# Patient Record
Sex: Female | Born: 1955 | Hispanic: Yes | Marital: Married | Smoking: Former smoker
Health system: Southern US, Community
[De-identification: ages and names within clinical notes are randomized; demographics above are authoritative.]

## PROBLEM LIST (undated history)

## (undated) DIAGNOSIS — E7800 Pure hypercholesterolemia, unspecified: Secondary | ICD-10-CM

## (undated) DIAGNOSIS — E079 Disorder of thyroid, unspecified: Secondary | ICD-10-CM

## (undated) HISTORY — DX: Disorder of thyroid, unspecified: E07.9

## (undated) HISTORY — PX: TONSILLECTOMY: SUR1361

## (undated) HISTORY — PX: LAPAROSCOPIC TUBAL LIGATION: SUR803

---

## 2004-02-11 ENCOUNTER — Ambulatory Visit: Payer: Self-pay

## 2004-05-11 ENCOUNTER — Emergency Department: Payer: Self-pay | Admitting: Emergency Medicine

## 2015-10-08 ENCOUNTER — Other Ambulatory Visit: Payer: Self-pay

## 2015-10-08 ENCOUNTER — Emergency Department: Payer: Self-pay

## 2015-10-08 ENCOUNTER — Emergency Department
Admission: EM | Admit: 2015-10-08 | Discharge: 2015-10-08 | Disposition: A | Discharge status: Home / Self Care | Payer: Self-pay | Attending: Emergency Medicine | Admitting: Emergency Medicine

## 2015-10-08 ENCOUNTER — Encounter: Payer: Self-pay | Admitting: *Deleted

## 2015-10-08 DIAGNOSIS — J018 Other acute sinusitis: Secondary | ICD-10-CM | POA: Insufficient documentation

## 2015-10-08 DIAGNOSIS — R0602 Shortness of breath: Secondary | ICD-10-CM | POA: Insufficient documentation

## 2015-10-08 DIAGNOSIS — Z87891 Personal history of nicotine dependence: Secondary | ICD-10-CM | POA: Insufficient documentation

## 2015-10-08 DIAGNOSIS — R42 Dizziness and giddiness: Secondary | ICD-10-CM

## 2015-10-08 LAB — BASIC METABOLIC PANEL
Anion gap: 8 (ref 5–15)
BUN: 19 mg/dL (ref 6–20)
CALCIUM: 9 mg/dL (ref 8.9–10.3)
CHLORIDE: 101 mmol/L (ref 101–111)
CO2: 27 mmol/L (ref 22–32)
CREATININE: 0.74 mg/dL (ref 0.44–1.00)
GFR calc Af Amer: >60 mL/min (ref >60)
GFR calc non Af Amer: >60 mL/min (ref >60)
Glucose, Bld: 112 mg/dL — ABNORMAL HIGH (ref 65–99)
Potassium: 3.8 mmol/L (ref 3.5–5.1)
SODIUM: 136 mmol/L (ref 135–145)

## 2015-10-08 LAB — URINALYSIS COMPLETE WITH MICROSCOPIC (ARMC ONLY)
BACTERIA UA: NONE SEEN
BILIRUBIN URINE: NEGATIVE
GLUCOSE, UA: NEGATIVE mg/dL
Hgb urine dipstick: NEGATIVE
Ketones, ur: NEGATIVE mg/dL
Leukocytes, UA: NEGATIVE
Nitrite: NEGATIVE
Protein, ur: NEGATIVE mg/dL
SQUAMOUS EPITHELIAL / LPF: NONE SEEN
Specific Gravity, Urine: 1.006 (ref 1.005–1.030)
pH: 8 (ref 5.0–8.0)

## 2015-10-08 LAB — CBC
HCT: 41.3 % (ref 35.0–47.0)
Hemoglobin: 14 g/dL (ref 12.0–16.0)
MCH: 29.4 pg (ref 26.0–34.0)
MCHC: 33.9 g/dL (ref 32.0–36.0)
MCV: 86.9 fL (ref 80.0–100.0)
PLATELETS: 275 10*3/uL (ref 150–440)
RBC: 4.76 MIL/uL (ref 3.80–5.20)
RDW: 12.8 % (ref 11.5–14.5)
WBC: 11.6 10*3/uL — AB (ref 3.6–11.0)

## 2015-10-08 LAB — BRAIN NATRIURETIC PEPTIDE: B Natriuretic Peptide: 23 pg/mL (ref 0.0–100.0)

## 2015-10-08 LAB — TROPONIN I

## 2015-10-08 MED ORDER — AMOXICILLIN-POT CLAVULANATE 875-125 MG PO TABS: 1.0000 | ORAL_TABLET | Freq: Two times a day (BID) | ORAL | Status: AC

## 2015-10-08 MED ORDER — MECLIZINE HCL 32 MG PO TABS: 32.0000 mg | ORAL_TABLET | Freq: Three times a day (TID) | ORAL | Status: DC | PRN

## 2015-10-08 NOTE — ED Notes (Signed)
Pt returned from MRI at this time

## 2015-10-08 NOTE — ED Notes (Signed)
Patient transported to MRI 

## 2015-10-08 NOTE — ED Notes (Signed)
Pt complains of waking up with shortness of breath, dizziness and chest discomfort, pt is in no distress in triage

## 2015-10-08 NOTE — ED Notes (Signed)
Pt ambulated to bathroom at this time independently with no concerns. Pt tolerating well.

## 2015-10-08 NOTE — ED Provider Notes (Signed)
[facility] Emergency Department Provider Note   ____________________________________________  Time seen: Approximately 9:16 AM  I have reviewed the triage vital signs and the nursing notes.   HISTORY  Chief Complaint Shortness of Breath and Dizziness    HPI Jordan Watts is a 60 y.o. female patient reports she got up in the middle of the night and had a lot of pressure in her head. She noticed she was sweating a lot and had some chest discomfort. Chest really wasn't a key to her anything he just didn't feel right perhaps tightness. When patient got up out of bed she noted she was very unsteady things were not spinning that she was lightheaded and unsteady and dizzy. She had 2 hold onto the wall because she had trouble walking. Present she still has some head pressure she also has swimmy headed feeling when she moves her head back and forth especially if she is laying flat. She reports she had this vertigo about 5 years ago and this is different. Things are not spinning around. She reports she's had several months of pain in her left breast she cannot really tell me what made the pain worse or what brought it on.   History reviewed. No pertinent past medical history.  There are no active problems to display for this patient.   History reviewed. No pertinent past surgical history.  Current Outpatient Rx  Name  Route  Sig  Dispense  Refill  . amoxicillin-clavulanate (AUGMENTIN) 875-125 MG tablet   Oral   Take 1 tablet by mouth 2 (two) times daily.   20 tablet   0   . meclizine (ANTIVERT) 32 MG tablet   Oral   Take 1 tablet (32 mg total) by mouth 3 (three) times daily as needed.   30 tablet   0     Allergies Review of patient's allergies indicates no known allergies.  No family history on file.  Social History Social History  Substance Use Topics  . Smoking status: Former Games developermoker  . Smokeless tobacco: None  . Alcohol Use: No     Review of Systems Constitutional: No fever/chills Eyes: No visual changes. ENT: No sore throat. Cardiovascular: Denies chest pain. Respiratory: Denies shortness of breath. Gastrointestinal: No abdominal pain.  No nausea, no vomiting.  No diarrhea.  No constipation. Genitourinary: Negative for dysuria. Musculoskeletal: Negative for back pain. Skin: Negative for rash. Neurological: Negative for focal weakness or numbness.  10-point ROS otherwise negative.  ____________________________________________   PHYSICAL EXAM:  VITAL SIGNS: ED Triage Vitals  Enc Vitals Group     BP 10/08/15 0842 156/71 mmHg     Pulse Rate 10/08/15 0842 81     Resp 10/08/15 0842 20     Temp 10/08/15 0842 97.8 F (36.6 C)     Temp Source 10/08/15 0842 Oral     SpO2 10/08/15 0842 99 %     Weight 10/08/15 0842 108 lb (48.988 kg)     Height 10/08/15 0842 5\' 1"  (1.549 m)     Head Cir --      Peak Flow --      Pain Score 10/08/15 0843 0     Pain Loc --      Pain Edu? --      Excl. in GC? --     Constitutional: Alert and oriented. Well appearing and in no acute distress. Eyes: Conjunctivae are normal. PERRL. EOMI. Head: Atraumatic. Nose: No congestion/rhinnorhea. Mouth/Throat: Mucous membranes are moist.  Oropharynx non-erythematous. Neck: No stridor. Cardiovascular: Normal rate, regular rhythm. Grossly normal heart sounds.  Good peripheral circulation. Respiratory: Normal respiratory effort.  No retractions. Lungs CTAB. Gastrointestinal: Soft and nontender. No distention. No abdominal bruits. No CVA tenderness. Musculoskeletal: No lower extremity tenderness nor edema.  No joint effusions. Neurologic:  Normal speech and language. No gross focal neurologic deficits are appreciated. No gait instability. Skin:  Skin is warm, dry and intact. No rash noted. Psychiatric: Mood and affect are normal. Speech and behavior are normal.  ____________________________________________   LABS (all labs  ordered are listed, but only abnormal results are displayed)  Labs Reviewed  BASIC METABOLIC PANEL - Abnormal; Notable for the following:    Glucose, Bld 112 (*)    All other components within normal limits  CBC - Abnormal; Notable for the following:    WBC 11.6 (*)    All other components within normal limits  URINALYSIS COMPLETEWITH MICROSCOPIC ([facility] ONLY) - Abnormal; Notable for the following:    Color, Urine STRAW (*)    APPearance CLEAR (*)    All other components within normal limits  TROPONIN I  BRAIN NATRIURETIC PEPTIDE   ____________________________________________  EKG  EKG read and interpreted by me shows normal sinus rhythm rate of 70 normal axis normal EKG ____________________________________________  RADIOLOGY  CHEST 2 VIEW  COMPARISON: None.  FINDINGS: Lungs are clear. Heart size and pulmonary vascularity are normal. No adenopathy. No bone lesions.  IMPRESSION: No edema or consolidation.   Electronically Signed  By: Bretta Bang III M.D.  On: 10/08/2015 09:44   _________MRI HEAD WITHOUT CONTRAST  TECHNIQUE: Multiplanar, multiecho pulse sequences of the brain and surrounding structures were obtained without intravenous contrast.  COMPARISON: None.  FINDINGS: Cerebral volume is normal. No restricted diffusion to suggest acute infarction. No midline shift, mass effect, evidence of mass lesion, ventriculomegaly, extra-axial collection or acute intracranial hemorrhage. Cervicomedullary junction and pituitary are within normal limits. Negative visualized cervical spine. Major intracranial vascular flow voids appear normal.  Scattered small mostly subcortical white matter T2 and FLAIR hyperintense foci in both hemispheres are nonspecific. The frontal lobes mostly are affected. No cortical encephalomalacia. No chronic cerebral blood products or abnormal mineralization evident on T2 * imaging. Deep gray matter nuclei, brainstem,  and cerebellum appear normal.  Visible internal auditory structures appear normal. Mastoids are clear. Negative orbit and scalp soft tissues. Normal bone marrow signal.  Paranasal sinus inflammation including right greater than left maxillary sinus fluid levels and scattered sinus mucosal thickening and opacification.  IMPRESSION: 1. No acute intracranial abnormality. 2. Mild to moderate for age nonspecific cerebral white matter signal changes. 3. Paranasal sinus inflammation suggesting acute sinusitis in the appropriate clinical setting.   Electronically Signed  By: Odessa Fleming M.D.  On: 10/08/2015 10:58___________________________________   PROCEDURES    ____________________________________________   INITIAL IMPRESSION / ASSESSMENT AND PLAN / ED COURSE  Pertinent labs & imaging results that were available during my care of the patient were reviewed by me and considered in my medical decision making (see chart for details).   ____________________________________________   FINAL CLINICAL IMPRESSION(S) / ED DIAGNOSES  Final diagnoses:  Dizziness  Other acute sinusitis      NEW MEDICATIONS STARTED DURING THIS VISIT:  Discharge Medication List as of 10/08/2015  3:46 PM    START taking these medications   Details  amoxicillin-clavulanate (AUGMENTIN) 875-125 MG tablet Take 1 tablet by mouth 2 (two) times daily., Starting 10/08/2015, Until Sun 10/18/15, Print    meclizine (ANTIVERT)  32 MG tablet Take 1 tablet (32 mg total) by mouth 3 (three) times daily as needed., Starting 10/08/2015, Until Discontinued, Print         Note:  This document was prepared using Dragon voice recognition software and may include unintentional dictation errors.    Arnaldo Natal, MD 10/08/15 1640

## 2015-10-08 NOTE — Discharge Instructions (Signed)
Mareos (Dizziness) Los mareos son un problema muy frecuente. Se trata de una sensacin de inestabilidad o de desvanecimiento. Puede sentir que se va a desmayar. Un mareo puede provocarle una lesin si se tropieza o se cae. Las Dealer de todas las edades pueden sufrir Research scientist (life sciences), Biomedical engineer es ms frecuente en los adultos [place]. Esta afeccin puede tener muchas causas, entre las que se pueden [employer], la deshidratacin y [place]. INSTRUCCIONES PARA EL CUIDADO EN EL HOGAR Estas indicaciones pueden ayudarlo con el trastorno: Comida y bebida  Beba suficiente lquido para Pharmacologist la orina clara o de color amarillo plido. Esto evita la deshidratacin. Trate de beber ms lquidos transparentes, como agua.  No beba alcohol.  Limite el consumo de cafena si el mdico se lo indica.  Limite el consumo de sal si el mdico se lo indica. Actividad  Evite los movimientos rpidos.  Levntese de las sillas con lentitud y apyese hasta sentirse bien.  Por la maana, sintese primero a un lado de la cama. Cuando se sienta bien, pngase lentamente de [address] se sostiene de algo, hasta que sepa que ha logrado el equilibrio.  Mueva las piernas con frecuencia si debe estar de pie en un lugar durante mucho tiempo. Mientras est de pie, contraiga y relaje los msculos de las piernas.  No conduzca vehculos ni opere maquinaria pesada si se siente mareado.  Evite agacharse si se siente mareado. En su casa, coloque los objetos de modo que le resulte fcil alcanzarlos sin Public librarian. Estilo de vida  No consuma ningn producto que contenga tabaco, lo que incluye cigarrillos, tabaco de Theatre manager o Administrator, Civil Service. Si necesita ayuda para dejar de fumar, consulte al [employer].  Trate de reducir el nivel de estrs practicando actividades como el yoga o la meditacin. Hable con el mdico si necesita ayuda. Instrucciones generales  Controle sus mareos para ver si hay cambios.  Tome los  medicamentos solamente como se lo haya indicado el mdico. Hable con el mdico si cree que los medicamentos que est tomando son la causa de sus mareos.  Infrmele a un amigo o a un familiar si se siente mareado. Pdale a esta persona que llame al mdico si observa cambios en su comportamiento.  Concurra a todas las visitas de control como se lo haya indicado el mdico. Esto es importante. SOLICITE ATENCIN MDICA SI:  Los [employer].  Los [employer] o la sensacin de Production assistant, radio.  Siente nuseas.  Ha perdido la audicin.  Aparecen nuevos sntomas.  Cuando est de pie se siente inestable o que la habitacin da vueltas. SOLICITE ATENCIN MDICA DE INMEDIATO SI:  Vomita o tiene diarrea y no puede comer ni beber nada.  Tiene dificultad para hablar, para caminar, para tragar o para [employer], las manos o las piernas.  Siente una debilidad generalizada.  No piensa con claridad o tiene dificultades para armar oraciones. Es posible que un amigo o un familiar adviertan que esto ocurre.  Tiene dolor de pecho, dolor abdominal, sudoracin o Company secretary.  Hay cambios en la visin.  Observa un sangrado.  Tiene dolores de [place].  Tiene dolor o rigidez en el cuello.  Tiene fiebre.   Esta informacin no tiene Theme park manager el consejo del mdico. Asegrese de hacerle al mdico cualquier pregunta que tenga.   Document Released: 04/18/2005 Document Revised: 09/02/2014 Elsevier Interactive Patient Education 2016 [employer].   Tome augmentin dos veces al dia con comida  Use agua salina en spray  5 veces por dia por una semana para desinflamar  Use meclezine para Emerson Electricmareos  Seguimiento con su Doctor  Regrese si Cendant Corporationempeora

## 2015-10-08 NOTE — ED Notes (Signed)
Pt made aware of impending d/c, but reasons for delay. Pt and family verbalized understanding at this time, no further needs.

## 2015-11-23 ENCOUNTER — Ambulatory Visit: Payer: Self-pay

## 2015-12-02 ENCOUNTER — Emergency Department
Admission: EM | Admit: 2015-12-02 | Discharge: 2015-12-03 | Disposition: A | Discharge status: Home / Self Care | Payer: Self-pay | Attending: Emergency Medicine | Admitting: Emergency Medicine

## 2015-12-02 ENCOUNTER — Encounter: Payer: Self-pay | Admitting: Emergency Medicine

## 2015-12-02 ENCOUNTER — Emergency Department: Payer: Self-pay

## 2015-12-02 DIAGNOSIS — Z79899 Other long term (current) drug therapy: Secondary | ICD-10-CM | POA: Insufficient documentation

## 2015-12-02 DIAGNOSIS — Z87891 Personal history of nicotine dependence: Secondary | ICD-10-CM | POA: Insufficient documentation

## 2015-12-02 DIAGNOSIS — R1013 Epigastric pain: Secondary | ICD-10-CM | POA: Insufficient documentation

## 2015-12-02 HISTORY — DX: Pure hypercholesterolemia, unspecified: E78.00

## 2015-12-02 LAB — LIPASE, BLOOD: LIPASE: 25 U/L (ref 11–51)

## 2015-12-02 LAB — COMPREHENSIVE METABOLIC PANEL
ALT: 167 U/L — AB (ref 14–54)
ANION GAP: 2 — AB (ref 5–15)
AST: 189 U/L — ABNORMAL HIGH (ref 15–41)
Albumin: 4.4 g/dL (ref 3.5–5.0)
Alkaline Phosphatase: 230 U/L — ABNORMAL HIGH (ref 38–126)
BUN: 10 mg/dL (ref 6–20)
CHLORIDE: 102 mmol/L (ref 101–111)
CO2: 34 mmol/L — AB (ref 22–32)
Calcium: 9.2 mg/dL (ref 8.9–10.3)
Creatinine, Ser: 0.63 mg/dL (ref 0.44–1.00)
Glucose, Bld: 97 mg/dL (ref 65–99)
POTASSIUM: 3.5 mmol/L (ref 3.5–5.1)
SODIUM: 138 mmol/L (ref 135–145)
Total Bilirubin: 0.9 mg/dL (ref 0.3–1.2)
Total Protein: 7.1 g/dL (ref 6.5–8.1)

## 2015-12-02 LAB — CBC WITH DIFFERENTIAL/PLATELET
BASOS ABS: 0.1 10*3/uL (ref 0–0.1)
Basophils Relative: 1 %
Eosinophils Absolute: 0.8 10*3/uL — ABNORMAL HIGH (ref 0–0.7)
Eosinophils Relative: 12 %
HCT: 39.9 % (ref 35.0–47.0)
HEMOGLOBIN: 14.1 g/dL (ref 12.0–16.0)
LYMPHS ABS: 1 10*3/uL (ref 1.0–3.6)
LYMPHS PCT: 16 %
MCH: 30.3 pg (ref 26.0–34.0)
MCHC: 35.3 g/dL (ref 32.0–36.0)
MCV: 85.8 fL (ref 80.0–100.0)
Monocytes Absolute: 0.4 10*3/uL (ref 0.2–0.9)
Monocytes Relative: 6 %
NEUTROS ABS: 4.3 10*3/uL (ref 1.4–6.5)
NEUTROS PCT: 65 %
PLATELETS: 231 10*3/uL (ref 150–440)
RBC: 4.65 MIL/uL (ref 3.80–5.20)
RDW: 12.5 % (ref 11.5–14.5)
WBC: 6.6 10*3/uL (ref 3.6–11.0)

## 2015-12-02 MED ORDER — DIATRIZOATE MEGLUMINE & SODIUM 66-10 % PO SOLN
15.0000 mL | ORAL | Status: AC
  Administered 2015-12-02: 15 mL via ORAL

## 2015-12-02 NOTE — ED Notes (Signed)
Patient transported to Ultrasound 

## 2015-12-02 NOTE — ED Notes (Signed)
Patient returned from radiology

## 2015-12-02 NOTE — ED Provider Notes (Signed)
[facility] Emergency Department Provider Note    ____________________________________________   I have reviewed the triage vital signs and the nursing notes.   HISTORY  Chief Complaint Abdominal Pain   History limited by: Language [facility] Interpreter utilized   HPI Jordan Watts is a 60 y.o. female who presents to the emergency department today because of concerns for epigastric pain. The pain started 2 days ago. It has been constant and gradually getting worse. It did start shortly after the patient finished breakfast. The patient denies any nausea or vomiting with this pain. Did feel she had chills. Never had pain like this before. Has not noticed any change in defecation or blood in stool.   Past Medical History:  Diagnosis Date  . Elevated cholesterol     There are no active problems to display for this patient.   History reviewed. No pertinent surgical history.  Prior to Admission medications   Medication Sig Start Date End Date Taking? Authorizing Provider  meclizine (ANTIVERT) 32 MG tablet Take 1 tablet (32 mg total) by mouth 3 (three) times daily as needed. 10/08/15   Arnaldo Natal, MD    Allergies Review of patient's allergies indicates no known allergies.  No family history on file.  Social History Social History  Substance Use Topics  . Smoking status: Former Games developer  . Smokeless tobacco: Never Used  . Alcohol use No    Review of Systems  Constitutional: Negative for fever. Cardiovascular: Negative for chest pain. Respiratory: Negative for shortness of breath. Gastrointestinal: Positive for epigastric pain. Genitourinary: Negative for dysuria. Musculoskeletal: Negative for back pain. Skin: Negative for rash. Neurological: Negative for headaches, focal weakness or numbness.  10-point ROS otherwise negative.  ____________________________________________   PHYSICAL EXAM:  VITAL SIGNS: ED Triage  Vitals  Enc Vitals Group     BP 12/02/15 2001 122/74     Pulse Rate 12/02/15 2001 87     Resp 12/02/15 2001 18     Temp 12/02/15 2001 99 F (37.2 C)     Temp Source 12/02/15 2001 Oral     SpO2 12/02/15 2001 97 %     Weight 12/02/15 2001 117 lb (53.1 kg)     Height 12/02/15 2001 4\' 11"  (1.499 m)     Head Circumference --      Peak Flow --      Pain Score 12/02/15 2002 8   Constitutional: Alert and oriented. Well appearing and in no distress. Eyes: Conjunctivae are normal. PERRL. Normal extraocular movements. ENT   Head: Normocephalic and atraumatic.   Nose: No congestion/rhinnorhea.   Mouth/Throat: Mucous membranes are moist.   Neck: No stridor. Hematological/Lymphatic/Immunilogical: No cervical lymphadenopathy. Cardiovascular: Normal rate, regular rhythm.  No murmurs, rubs, or gallops. Respiratory: Normal respiratory effort without tachypnea nor retractions. Breath sounds are clear and equal bilaterally. No wheezes/rales/rhonchi. Gastrointestinal: Soft and tender to the epigastric region, no rebound, no guarding. Genitourinary: Deferred Musculoskeletal: Normal range of motion in all extremities. No joint effusions.  No lower extremity tenderness nor edema. Neurologic:  Normal speech and language. No gross focal neurologic deficits are appreciated.  Skin:  Skin is warm, dry and intact. No rash noted. Psychiatric: Mood and affect are normal. Speech and behavior are normal. Patient exhibits appropriate insight and judgment.  ____________________________________________    LABS (pertinent positives/negatives)  Labs Reviewed  COMPREHENSIVE METABOLIC PANEL - Abnormal; Notable for the following:       Result Value   CO2 34 (*)  AST 189 (*)    ALT 167 (*)    Alkaline Phosphatase 230 (*)    Anion gap 2 (*)    All other components within normal limits  CBC WITH DIFFERENTIAL/PLATELET - Abnormal; Notable for the following:    Eosinophils Absolute 0.8 (*)    All other  components within normal limits  LIPASE, BLOOD  URINALYSIS COMPLETEWITH MICROSCOPIC ([facility] ONLY)     ____________________________________________   EKG  None  ____________________________________________    RADIOLOGY  [place] RUQ IMPRESSION: Negative RIGHT upper quadrant ultrasound.  CT abd/pel pending ____________________________________________   PROCEDURES  Procedures  ____________________________________________   INITIAL IMPRESSION / ASSESSMENT AND PLAN / ED COURSE  Pertinent labs & imaging results that were available during my care of the patient were reviewed by me and considered in my medical decision making (see chart for details).  Patient presented to the emergency department today because of 2 day history of epigastric pain and elevated liver enzymes by primary care physician. On exam she is mildly tender in the epigastric region. Will check blood work here and get right upper quadrant ultrasound  Clinical Course   Right upper quadrant ultrasound without any concerning acute findings. This point unclear etiology of the patient's pain and transaminitis. Will obtain CT scan to further evaluate ____________________________________________   FINAL CLINICAL IMPRESSION(S) / ED DIAGNOSES  Final diagnoses:  Epigastric pain     Note: This dictation was prepared with Dragon dictation. Any transcriptional errors that result from this process are unintentional    Phineas Semen, MD 12/02/15 2309

## 2015-12-02 NOTE — ED Triage Notes (Signed)
Pt was sent by PCP to have ultrasound, pt reports was told she has elevated liver enzymes. Pt c/o left abdominal pain radiating to back. Pt denies vomiting, nausea, or diarrhea. Pt alert and oriented x 4, no increased work in breathing noted.

## 2015-12-03 ENCOUNTER — Emergency Department: Payer: Self-pay

## 2015-12-03 ENCOUNTER — Encounter: Payer: Self-pay | Admitting: Radiology

## 2015-12-03 LAB — URINALYSIS COMPLETE WITH MICROSCOPIC (ARMC ONLY)
Bilirubin Urine: NEGATIVE
Glucose, UA: NEGATIVE mg/dL
LEUKOCYTES UA: NEGATIVE
Nitrite: NEGATIVE
PH: 7 (ref 5.0–8.0)
PROTEIN: NEGATIVE mg/dL
SPECIFIC GRAVITY, URINE: 1.005 (ref 1.005–1.030)

## 2015-12-03 MED ORDER — IOPAMIDOL (ISOVUE-300) INJECTION 61%
100.0000 mL | Freq: Once | INTRAVENOUS | Status: AC | PRN
  Administered 2015-12-03: 100 mL via INTRAVENOUS

## 2015-12-03 MED ORDER — OMEPRAZOLE 10 MG PO CPDR: 10.0000 mg | DELAYED_RELEASE_CAPSULE | Freq: Every day | ORAL | 1 refills | Status: DC

## 2015-12-03 MED ORDER — GI COCKTAIL ~~LOC~~
30.0000 mL | Freq: Once | ORAL | Status: AC
  Administered 2015-12-03: 30 mL via ORAL
  Filled 2015-12-03: qty 30

## 2015-12-03 NOTE — ED Provider Notes (Addendum)
-----------------------------------------   3:17 AM on 12/03/2015 -----------------------------------------  Care was assumed from Dr. Derrill Kay at approximately midnight pending results of the CT scan which is negative for any acute intra-abdominal pelvic pathology. Nonemergent MRI is recommended for a benign-appearing hypodense focus in the neck of the pancreas however ducts were normal. Patient reports significant improvement of her symptoms at this time after GI cocktail, I discussed with her that this could be GERD, gastritis, virus causing hepatitis. she reports to me that she has already discontinued her cholesterol medicine because her doctor told her that that could be was causing her liver test to be elevated. We'll start omeprazole. I discussed with the patient as well as her family at bedside that she needs to follow-up with her primary care doctor within the next 1-2 days for reassessment, additional testing, and so that nonemergent MRI can be scheduled. We discussed meticulous return precautions and all are comfortable with the discharge plan. DC home. EKG was unchanged from the one obtained on 10/09/2015.  CT abdomen and pelvis IMPRESSION: No acute intra-abdominal or pelvic pathology.  Subcentimeter benign-appearing hypodense focus in the neck of the pancreas. Nonemergent MRI is recommended for further evaluation.   Gayla Doss, MD 12/03/15 5056    Gayla Doss, MD 12/03/15 [phone]

## 2015-12-03 NOTE — Discharge Instructions (Signed)
You were seen in the emergency department for abdominal pain, cause of which is not clear. This could be related to formation of your stomach lining, gastritis, gastric reflux, or possible virus causing elevation of your liver function tests. You need to follow up as soon as possible with your primary care doctor for additional testing and so that a nonemergent MRI can be scheduled to evaluate your pancreas. Return immediately to the emergency department if you develop severe or worsening abdominal pain, vomiting, blood in vomit or stool, chest pain, difficulty breathing, inability of the bowel movement, diarrhea, fevers, chills or for any other concerns.

## 2015-12-16 ENCOUNTER — Encounter (INDEPENDENT_AMBULATORY_CARE_PROVIDER_SITE_OTHER): Payer: Self-pay

## 2015-12-16 ENCOUNTER — Ambulatory Visit: Payer: Self-pay | Attending: Internal Medicine

## 2015-12-16 ENCOUNTER — Ambulatory Visit
Admission: RE | Admit: 2015-12-16 | Discharge: 2015-12-16 | Disposition: A | Discharge status: Home / Self Care | Payer: Self-pay | Source: Ambulatory Visit | Attending: Oncology | Admitting: Oncology

## 2015-12-16 VITALS — BP 102/68 | HR 59 | Temp 98.1°F | Ht 60.24 in | Wt 115.1 lb

## 2015-12-16 DIAGNOSIS — Z0000 Encounter for general adult medical examination without abnormal findings: Secondary | ICD-10-CM

## 2015-12-16 NOTE — Progress Notes (Signed)
Subjective:     Patient ID: Jordan Watts, female   DOB: 1956/01/04, 60 y.o.   MRN: [id number]  HPI   Review of Systems     Objective:   Physical Exam  Pulmonary/Chest: Right breast exhibits no inverted nipple, no mass, no nipple discharge, no skin change and no tenderness. Left breast exhibits no inverted nipple, no mass, no nipple discharge, no skin change and no tenderness. Breasts are symmetrical.       Assessment:     60 year old hispanic patient presents for BCCCP clinic visit.  Patient screened, and meets BCCCP eligibility.  Patient does not have insurance, Medicare or Medicaid.  Handout given on Affordable Care Act.  Instructed patient on breast self-exam using teach back method. CBE unremarkable.  No mass or lump palpated.   Patient seen recently in ED for pelvic pain, and sinus infection.  Experiencing vertigo x 2 months, but getting better.  Jordan HaringLoyda Murr interpreted exam.    Plan:     Sent for bilateral screening mammogram.Sent for bilateral screening mammogram.

## 2015-12-17 NOTE — Progress Notes (Signed)
Letter mailed from [facility] to notify of normal mammogram results.  Patient to return in one year for annual screening.  Copy to [facility]. 

## 2016-07-27 ENCOUNTER — Other Ambulatory Visit: Payer: Self-pay | Admitting: Family Medicine

## 2016-07-27 DIAGNOSIS — R1030 Lower abdominal pain, unspecified: Secondary | ICD-10-CM

## 2016-08-01 ENCOUNTER — Ambulatory Visit
Admission: RE | Admit: 2016-08-01 | Discharge: 2016-08-01 | Disposition: A | Discharge status: Home / Self Care | Payer: Self-pay | Source: Ambulatory Visit | Attending: Family Medicine | Admitting: Family Medicine

## 2016-08-01 DIAGNOSIS — R1030 Lower abdominal pain, unspecified: Secondary | ICD-10-CM | POA: Insufficient documentation

## 2018-12-26 ENCOUNTER — Encounter (INDEPENDENT_AMBULATORY_CARE_PROVIDER_SITE_OTHER): Payer: Self-pay

## 2018-12-26 ENCOUNTER — Ambulatory Visit: Payer: Self-pay | Attending: Oncology

## 2018-12-26 ENCOUNTER — Other Ambulatory Visit: Payer: Self-pay

## 2018-12-26 ENCOUNTER — Ambulatory Visit
Admission: RE | Admit: 2018-12-26 | Discharge: 2018-12-26 | Disposition: A | Discharge status: Home / Self Care | Payer: Self-pay | Source: Ambulatory Visit | Attending: Oncology | Admitting: Oncology

## 2018-12-26 VITALS — BP 138/65 | HR 63 | Temp 98.0°F | Resp 16 | Ht 59.65 in | Wt 125.3 lb

## 2018-12-26 DIAGNOSIS — Z0000 Encounter for general adult medical examination without abnormal findings: Secondary | ICD-10-CM

## 2018-12-26 NOTE — Progress Notes (Signed)
  Subjective:     Patient ID: Jordan Watts, female   DOB: [birth year], 63 y.o.   MRN: [id number]  HPI   Review of Systems     Objective:   Physical Exam Chest:     Breasts:        Right: No swelling, bleeding, inverted nipple, mass, nipple discharge, skin change or tenderness.        Left: No swelling, bleeding, inverted nipple, mass, nipple discharge, skin change or tenderness.  Genitourinary:    Labia:        Right: No rash, tenderness, lesion or injury.        Left: No rash, tenderness, lesion or injury.         Assessment:     63 year old hispanic patient, fluent in [place], returns for BCCCP screening. Patient screened, and meets BCCCP eligibility.  Patient does not have insurance, Medicare or Medicaid. Instructed patient on breast self awareness using teach back method.  Clinical breast exam unremarkable.  No mass or lump.  Pelvic exam normal.  Complains of vaginal dryness.  Discussed moisturizing options.    Plan:     Sent for bilateral screening mammogram. Specimen collected for pap.

## 2018-12-27 ENCOUNTER — Other Ambulatory Visit: Payer: Self-pay | Admitting: *Deleted

## 2018-12-27 DIAGNOSIS — N63 Unspecified lump in unspecified breast: Secondary | ICD-10-CM

## 2018-12-31 LAB — PAP LB AND HPV HIGH-RISK

## 2018-12-31 LAB — HPV, LOW VOLUME (REFLEX): HPV low volume reflex: NEGATIVE

## 2019-01-04 ENCOUNTER — Ambulatory Visit
Admission: RE | Admit: 2019-01-04 | Discharge: 2019-01-04 | Disposition: A | Discharge status: Home / Self Care | Payer: Self-pay | Source: Ambulatory Visit | Attending: Oncology | Admitting: Oncology

## 2019-01-04 DIAGNOSIS — N63 Unspecified lump in unspecified breast: Secondary | ICD-10-CM

## 2019-05-15 ENCOUNTER — Other Ambulatory Visit: Payer: Self-pay

## 2019-05-15 DIAGNOSIS — N63 Unspecified lump in unspecified breast: Secondary | ICD-10-CM

## 2019-05-15 NOTE — Progress Notes (Signed)
Patient ID: Jordan Watts, female   DOB: 1955-09-18, 64 y.o.   MRN: [id number] Radiologist discussed Birads 3 results, and recommendation for 6 month follow-up imaging with patient.  To return on 07/05/2019 to [facility].  Appointment info mailed to patient.

## 2019-07-05 ENCOUNTER — Other Ambulatory Visit: Payer: Self-pay

## 2019-07-20 ENCOUNTER — Ambulatory Visit: Payer: Self-pay | Attending: Internal Medicine

## 2019-07-20 ENCOUNTER — Ambulatory Visit: Payer: Self-pay

## 2019-07-20 DIAGNOSIS — Z23 Encounter for immunization: Secondary | ICD-10-CM

## 2019-07-20 NOTE — Progress Notes (Signed)
   Covid-19 Vaccination Clinic  Name:  Jordan Watts    MRN: [id number] DOB: 08-20-1955  07/20/2019  [name] was observed post Covid-19 immunization for 15 minutes without incident. She was provided with Vaccine Information Sheet and instruction to access the V-Safe system.   [name] was instructed to call 911 with any severe reactions post vaccine: [name] Kitchen Difficulty breathing  . Swelling of face and throat  . A fast heartbeat  . A bad rash all over body  . Dizziness and weakness   Immunizations Administered    Name Date Dose VIS Date Route   Pfizer COVID-19 Vaccine 07/20/2019  4:49 PM 0.3 mL 04/12/2019 Intramuscular   Manufacturer: [employer], [employer]   Lot: [id number]   NDC: [address]-3

## 2019-07-23 ENCOUNTER — Ambulatory Visit
Admission: RE | Admit: 2019-07-23 | Discharge: 2019-07-23 | Disposition: A | Discharge status: Home / Self Care | Payer: Self-pay | Source: Ambulatory Visit | Attending: Oncology | Admitting: Oncology

## 2019-07-23 DIAGNOSIS — N63 Unspecified lump in unspecified breast: Secondary | ICD-10-CM

## 2019-08-10 ENCOUNTER — Ambulatory Visit: Payer: Self-pay | Attending: Internal Medicine

## 2019-08-10 DIAGNOSIS — Z23 Encounter for immunization: Secondary | ICD-10-CM

## 2019-08-10 NOTE — Progress Notes (Signed)
   Covid-19 Vaccination Clinic  Name:  Jordan Watts    MRN: [id number] DOB: July 25, 1955  08/10/2019  [name] was observed post Covid-19 immunization for 15 minutes without incident. She was provided with Vaccine Information Sheet and instruction to access the V-Safe system. Medical interpreter used.  [name] was instructed to call 911 with any severe reactions post vaccine: [name] Kitchen Difficulty breathing  . Swelling of face and throat  . A fast heartbeat  . A bad rash all over body  . Dizziness and weakness   Immunizations Administered    Name Date Dose VIS Date Route   Pfizer COVID-19 Vaccine 08/10/2019  4:04 PM 0.3 mL 04/12/2019 Intramuscular   Manufacturer: [employer], [employer]   Lot: [id number]   NDC: [address]-7

## 2019-08-14 NOTE — Progress Notes (Signed)
Patient ID: Jordan Watts, female   DOB: February 22, 1956, 64 y.o.   MRN: [id number] Patient scheduled to return on 12/25/19 at 12:30 for annual [facility] screening.  Appointment reminder mailed.

## 2019-08-15 NOTE — Progress Notes (Signed)
Mailed [facility] annual appointment reminder for 12/25/19.

## 2019-12-25 ENCOUNTER — Ambulatory Visit: Payer: Self-pay | Attending: Oncology

## 2019-12-25 ENCOUNTER — Telehealth: Payer: Self-pay | Admitting: *Deleted

## 2020-08-04 ENCOUNTER — Ambulatory Visit: Payer: Self-pay | Attending: Oncology | Admitting: *Deleted

## 2020-08-04 ENCOUNTER — Encounter: Payer: Self-pay | Admitting: *Deleted

## 2020-08-04 ENCOUNTER — Other Ambulatory Visit: Payer: Self-pay

## 2020-08-04 ENCOUNTER — Ambulatory Visit
Admission: RE | Admit: 2020-08-04 | Discharge: 2020-08-04 | Disposition: A | Discharge status: Home / Self Care | Payer: Self-pay | Source: Ambulatory Visit | Attending: Oncology | Admitting: Oncology

## 2020-08-04 VITALS — BP 140/71 | HR 60 | Temp 97.8°F | Ht 59.0 in | Wt 124.5 lb

## 2020-08-04 DIAGNOSIS — Z0000 Encounter for general adult medical examination without abnormal findings: Secondary | ICD-10-CM

## 2020-08-04 NOTE — Patient Instructions (Signed)
Gave patient hand-out, Women Staying Healthy, Active and Well from [facility], with education on breast health, pap smears, heart and colon health. 

## 2020-08-04 NOTE — Progress Notes (Signed)
  Subjective:     Patient ID: Jordan Watts, female   DOB: 02/27/1956, 65 y.o.   MRN: [id number]  HPI   [facility] Medical History Record - 08/04/20 1022      Breast History   Screening cycle Rescreen    CBE Date 12/26/18    Provider (CBE) [facility]    Initial Mammogram 08/04/20    Last Mammogram Annual    Last Mammogram Date 12/26/18    Provider (Mammogram)  Delford Field    Recent Breast Symptoms None      Breast Cancer History   Breast Cancer History No personal or family history      Previous History of Breast Problems   Breast Surgery or Biopsy None    Breast Implants N/A      Gynecological/Obstetrical History   LMP 12/16/07    Is there any chance that the client could be pregnant?  No    Age at menarche 34    Age at menopause 63    PAP smear history Annually    Date of last PAP  12/26/18    Provider (PAP) [facility]    Age at first live birth 23    Breast fed children Yes (type length in comments)   3 months   DES Exposure Unkown    Cervical, Uterine or Ovarian cancer No    Family history of Cervial, Uterine or Ovarian cancer No    Hysterectomy No    Ovaries removed No    Laser/Cryosurgery No    Current method of birth control None    Current method of Estrogen/Hormone replacement None    Smoking history None             Review of Systems     Objective:   Physical Exam Chest:  Breasts:     Right: No swelling, bleeding, inverted nipple, mass, nipple discharge, skin change, tenderness, axillary adenopathy or supraclavicular adenopathy.     Left: No swelling, bleeding, inverted nipple, mass, nipple discharge, skin change, tenderness, axillary adenopathy or supraclavicular adenopathy.     Lymphadenopathy:     Upper Body:     Right upper body: No supraclavicular or axillary adenopathy.     Left upper body: No supraclavicular or axillary adenopathy.        Assessment:     65 year old Hispanic female returns to [facility] for annual screening.  Loyda, the  interpreter present during the interview and exam.  Clinical breast exam unremarkable.  Taught self breast awareness.  Last pap on 12/26/18 was negative with a negative low volume HPV.  Next pap in 5 years.  Patient has been screened for eligibility.  She does not have any insurance, Medicare or Medicaid.  She also meets financial eligibility.   Risk Assessment    Risk Scores      08/04/2020 12/26/2018   Last edited by: Jim Like, RN Guerry Minors, CMA   5-year risk: 0.8 %    Lifetime risk: 3.9 %             Plan:     Screening mammogram ordered.  Will follow up per [facility] protocol.

## 2020-08-14 ENCOUNTER — Encounter: Payer: Self-pay | Admitting: *Deleted

## 2020-08-14 NOTE — Progress Notes (Signed)
Letter mailed from the [facility] to inform patient of her normal mammogram results.  Patient is to follow-up with annual screening in one year. 

## 2021-09-28 DIAGNOSIS — U071 COVID-19: Secondary | ICD-10-CM

## 2021-09-28 HISTORY — DX: COVID-19: U07.1

## 2021-10-10 ENCOUNTER — Emergency Department (INDEPENDENT_AMBULATORY_CARE_PROVIDER_SITE_OTHER): Payer: Self-pay

## 2021-10-10 ENCOUNTER — Emergency Department
Admission: EM | Admit: 2021-10-10 | Discharge: 2021-10-10 | Disposition: A | Discharge status: Home / Self Care | Payer: Self-pay | Source: Home / Self Care

## 2021-10-10 ENCOUNTER — Encounter: Payer: Self-pay | Admitting: Emergency Medicine

## 2021-10-10 DIAGNOSIS — R5383 Other fatigue: Secondary | ICD-10-CM

## 2021-10-10 DIAGNOSIS — U099 Post covid-19 condition, unspecified: Secondary | ICD-10-CM

## 2021-10-10 DIAGNOSIS — R0789 Other chest pain: Secondary | ICD-10-CM

## 2021-10-10 DIAGNOSIS — B9789 Other viral agents as the cause of diseases classified elsewhere: Secondary | ICD-10-CM

## 2021-10-10 DIAGNOSIS — E060 Acute thyroiditis: Secondary | ICD-10-CM

## 2021-10-10 DIAGNOSIS — R059 Cough, unspecified: Secondary | ICD-10-CM

## 2021-10-10 DIAGNOSIS — G9332 Myalgic encephalomyelitis/chronic fatigue syndrome: Secondary | ICD-10-CM

## 2021-10-10 MED ORDER — DEXAMETHASONE 6 MG PO TABS: 6.0000 mg | ORAL_TABLET | Freq: Every day | ORAL | 0 refills | Status: AC

## 2021-10-10 MED ORDER — AZITHROMYCIN 250 MG PO TABS
ORAL_TABLET | ORAL | 0 refills | Status: DC
Start: 2021-10-10 — End: 2021-10-29

## 2021-10-10 NOTE — ED Provider Notes (Signed)
[facility]    CSN: [id number] Arrival date & time: 10/10/21  1237      History   Chief Complaint Chief Complaint  Patient presents with   Facial Pain    HPI Jordan Watts is a 66 y.o. female.   66yo female presents today due to extreme fatigue. She states 3 weeks ago she had covid. At that time, she had sore through, sinus pain, and congestion. She got over covid through use of Elderberry, onion and oregano home remedies. Pt states however that many of her URI sx actually got worse one week ago on Monday. She denies fever. She states all she wants to do is sleep. Her muscles ache. Her only PMH is hypothyroidism for which she takes synthroid. She does feel a slight chest tightness and developed a dry cough intermittently recently. She is usually very active around the house, cleaning and rarely sits down, but for the past 6 days has needed to nap.      Past Medical History:  Diagnosis Date   COVID-19 09/28/2021   Elevated cholesterol     There are no problems to display for this patient.   Past Surgical History:  Procedure Laterality Date   LAPAROSCOPIC TUBAL LIGATION     TONSILLECTOMY      OB History   No obstetric history on file.      Home Medications    Prior to Admission medications   Medication Sig Start Date End Date Taking? Authorizing Provider  azithromycin (ZITHROMAX Z-PAK) 250 MG tablet Take two tabs (500mg ) PO in a single dose today, followed by one tab (250mg ) PO daily days 2-5. 10/10/21  Yes Shima Compere L, PA  dexamethasone (DECADRON) 6 MG tablet Take 1 tablet (6 mg total) by mouth daily for 5 days. 10/10/21 10/15/21 Yes Hydie Langan L, PA  levothyroxine (SYNTHROID) 75 MCG tablet Take 75 mcg by mouth daily. 07/19/21   [provider]    Family History Family History  Problem Relation Age of Onset   Hypertension Mother    Dementia Mother    Aneurysm Mother    Hernia Mother    Breast cancer Neg Hx     Social  History Social History   Tobacco Use   Smoking status: Former   Smokeless tobacco: Never  Building services engineerVaping Use   Vaping Use: Never used  Substance Use Topics   Alcohol use: No     Allergies   Patient has no known allergies.   Review of Systems Review of Systems As per HPI  Physical Exam Triage Vital Signs ED Triage Vitals  Enc Vitals Group     BP 10/10/21 1259 131/79     Pulse Rate 10/10/21 1259 63     Resp 10/10/21 1259 17     Temp 10/10/21 1259 99 F (37.2 C)     Temp Source 10/10/21 1259 Oral     SpO2 10/10/21 1259 98 %     Weight 10/10/21 1302 123 lb 7.3 oz (56 kg)     Height 10/10/21 1302 4\' 11"  (1.499 m)     Head Circumference --      Peak Flow --      Pain Score 10/10/21 1302 4     Pain Loc --      Pain Edu? --      Excl. in GC? --    No data found.  Updated Vital Signs BP 131/79 (BP Location: Left Arm)   Pulse 63   Temp  99 F (37.2 C) (Oral)   Resp 17   Ht 4\' 11"  (1.499 m)   Wt 123 lb 7.3 oz (56 kg)   LMP 12/16/2007 (Approximate)   SpO2 98%   BMI 24.94 kg/m   Visual Acuity Right Eye Distance:   Left Eye Distance:   Bilateral Distance:    Right Eye Near:   Left Eye Near:    Bilateral Near:     Physical Exam Vitals and nursing note reviewed. Exam conducted with a chaperone present.  Constitutional:      General: She is not in acute distress.    Appearance: Normal appearance. She is well-developed and normal weight. She is not ill-appearing, toxic-appearing or diaphoretic.  HENT:     Head: Normocephalic and atraumatic.     Right Ear: Tympanic membrane, ear canal and external ear normal. There is no impacted cerumen.     Left Ear: Tympanic membrane, ear canal and external ear normal. There is no impacted cerumen.     Nose: Nose normal. No congestion or rhinorrhea.     Mouth/Throat:     Mouth: Mucous membranes are moist.     Pharynx: No oropharyngeal exudate or posterior oropharyngeal erythema.  Eyes:     Extraocular Movements: Extraocular  movements intact.     Conjunctiva/sclera: Conjunctivae normal.     Pupils: Pupils are equal, round, and reactive to light.  Cardiovascular:     Rate and Rhythm: Normal rate and regular rhythm.     Heart sounds: No murmur heard. Pulmonary:     Effort: Pulmonary effort is normal. No respiratory distress.     Breath sounds: Normal breath sounds. No stridor. No wheezing, rhonchi or rales.  Chest:     Chest wall: No tenderness.  Abdominal:     Palpations: Abdomen is soft.     Tenderness: There is no abdominal tenderness.  Musculoskeletal:        General: No swelling.     Cervical back: Normal range of motion and neck supple. Tenderness (to anterior neck/ thyroid) present. No rigidity.  Lymphadenopathy:     Cervical: No cervical adenopathy.  Skin:    General: Skin is warm and dry.     Capillary Refill: Capillary refill takes less than 2 seconds.     Coloration: Skin is not jaundiced.     Findings: No erythema or rash.  Neurological:     General: No focal deficit present.     Mental Status: She is alert and oriented to person, place, and time.  Psychiatric:        Mood and Affect: Mood normal.      UC Treatments / Results  Labs (all labs ordered are listed, but only abnormal results are displayed) Labs Reviewed  CBC WITH DIFFERENTIAL/PLATELET  COMPLETE METABOLIC PANEL WITH GFR  TSH    EKG   Radiology DG Chest 2 View  Result Date: 10/10/2021 CLINICAL DATA:  Cough, fatigue and chest discomfort for 2-3 weeks. EXAM: CHEST - 2 VIEW COMPARISON:  10/08/2015. FINDINGS: Normal heart, mediastinum and hila. Clear lungs.  No pleural effusion or pneumothorax. Skeletal structures are intact. IMPRESSION: No active cardiopulmonary disease. Electronically Signed   By: 12/08/2015 M.D.   On: 10/10/2021 14:05    Procedures Procedures (including critical care time)  Medications Ordered in UC Medications - No data to display  Initial Impression / Assessment and Plan / UC Course  I have  reviewed the triage vital signs and the nursing notes.  Pertinent labs &  imaging results that were available during my care of the patient were reviewed by me and considered in my medical decision making (see chart for details).     Covid 19 with fatigue - CXR negative for pneumonia. Suspect pt's sx related to long covid. Pt does have 3 weeks of sinus pain and pressure as well. Will start dexamethasone 6mg  daily, azithromycin. May try OTC quercetin as well.  Viral thyroiditis - CBC, CMP, TSH today. Dexamethasone should help with this. Has follow up scheduled with PCP in several weeks. RTC if any new or worsening symptoms.  Final Clinical Impressions(s) / UC Diagnoses   Final diagnoses:  COVID-19 long hauler manifesting chronic fatigue  Other fatigue  Acute viral thyroiditis     Discharge Instructions      Your symptoms are likely secondary to your recent covid infection. Your chest xray is normal. You also likely have acute thyroiditis. We have checked a CBC, CMP and TSH today. Start taking dexamethasone one tablet daily, best taken with breakfast in the morning. Start taking Zpack per package instructions. You may purchase Quercetin, an OTC supplement. Follow up with your PCP. Check mychart for your lab results, we will call with instructions if abnormal. Rest. Drink plenty of water.     ED Prescriptions     Medication Sig Dispense Auth. Provider   dexamethasone (DECADRON) 6 MG tablet Take 1 tablet (6 mg total) by mouth daily for 5 days. 5 tablet Haliyah Fryman L, PA   azithromycin (ZITHROMAX Z-PAK) 250 MG tablet Take two tabs (500mg ) PO in a single dose today, followed by one tab (250mg ) PO daily days 2-5. 6 tablet Julieanne Hadsall L, PA      PDMP not reviewed this encounter.   , 10/10/21 1616

## 2021-10-10 NOTE — ED Triage Notes (Signed)
Positive for COVID 13 days ago  Here w/ daughter  Interpretor refused - pt speak & understands english - daughter also present to interpret Tylenol 500 mg at 1100 for sinus H/A  Taking Zyrtec  Denies fever Complaints of fatigue

## 2021-10-10 NOTE — Discharge Instructions (Signed)
Your symptoms are likely secondary to your recent covid infection. Your chest xray is normal. You also likely have acute thyroiditis. We have checked a CBC, CMP and TSH today. Start taking dexamethasone one tablet daily, best taken with breakfast in the morning. Start taking Zpack per package instructions. You may purchase Quercetin, an OTC supplement. Follow up with your PCP. Check mychart for your lab results, we will call with instructions if abnormal. Rest. Drink plenty of water.

## 2021-10-11 LAB — CBC WITH DIFFERENTIAL/PLATELET
Absolute Monocytes: 390 cells/uL (ref 200–950)
Basophils Absolute: 53 cells/uL (ref 0–200)
Basophils Relative: 0.7 %
Eosinophils Absolute: 255 cells/uL (ref 15–500)
Eosinophils Relative: 3.4 %
HCT: 40.8 % (ref 35.0–45.0)
Hemoglobin: 13.3 g/dL (ref 11.7–15.5)
Lymphs Abs: 2963 cells/uL (ref 850–3900)
MCH: 29.6 pg (ref 27.0–33.0)
MCHC: 32.6 g/dL (ref 32.0–36.0)
MCV: 90.7 fL (ref 80.0–100.0)
MPV: 9.3 fL (ref 7.5–12.5)
Monocytes Relative: 5.2 %
Neutro Abs: 3840 cells/uL (ref 1500–7800)
Neutrophils Relative %: 51.2 %
Platelets: 323 10*3/uL (ref 140–400)
RBC: 4.5 10*6/uL (ref 3.80–5.10)
RDW: 13.1 % (ref 11.0–15.0)
Total Lymphocyte: 39.5 %
WBC: 7.5 10*3/uL (ref 3.8–10.8)

## 2021-10-11 LAB — TSH: TSH: 0.39 mIU/L — ABNORMAL LOW (ref 0.40–4.50)

## 2021-10-11 LAB — COMPLETE METABOLIC PANEL WITH GFR
AG Ratio: 1.6 (calc) (ref 1.0–2.5)
ALT: 36 U/L — ABNORMAL HIGH (ref 6–29)
AST: 29 U/L (ref 10–35)
Albumin: 4 g/dL (ref 3.6–5.1)
Alkaline phosphatase (APISO): 103 U/L (ref 37–153)
BUN: 15 mg/dL (ref 7–25)
CO2: 25 mmol/L (ref 20–32)
Calcium: 9.2 mg/dL (ref 8.6–10.4)
Chloride: 103 mmol/L (ref 98–110)
Creat: 0.67 mg/dL (ref 0.50–1.05)
Globulin: 2.5 g/dL (calc) (ref 1.9–3.7)
Glucose, Bld: 82 mg/dL (ref 65–99)
Potassium: 4.1 mmol/L (ref 3.5–5.3)
Sodium: 139 mmol/L (ref 135–146)
Total Bilirubin: 0.2 mg/dL (ref 0.2–1.2)
Total Protein: 6.5 g/dL (ref 6.1–8.1)
eGFR: 98 mL/min/{1.73_m2} (ref >=60)

## 2021-10-29 ENCOUNTER — Emergency Department (INDEPENDENT_AMBULATORY_CARE_PROVIDER_SITE_OTHER)
Admission: EM | Admit: 2021-10-29 | Discharge: 2021-10-29 | Disposition: A | Discharge status: Home / Self Care | Payer: Self-pay | Source: Home / Self Care

## 2021-10-29 ENCOUNTER — Other Ambulatory Visit: Payer: Self-pay

## 2021-10-29 ENCOUNTER — Encounter: Payer: Self-pay | Admitting: Emergency Medicine

## 2021-10-29 DIAGNOSIS — R5383 Other fatigue: Secondary | ICD-10-CM

## 2021-10-29 NOTE — ED Triage Notes (Signed)
Jaw pain, throat feels closed, hard to swallow, fatigue, legs feel heavy x 1 week. Had COVID in May.Was given ABT and Steroids on 6/11, felt better, feeling sick again.

## 2021-10-29 NOTE — ED Provider Notes (Signed)
[facility]    CSN: [id number] Arrival date & time: 10/29/21  1456      History   Chief Complaint Chief Complaint  Patient presents with   Jaw Pain    HPI Jordan Watts is a 66 y.o. female.   HPI Pleasant 66 year old female presents with sore throat, difficulties to swallow, fatigue and legs feeling heavy for 1 week.  Patient was evaluated here on 10/10/2021 diagnosed with COVID-19 long hauler's manifesting chronic fatigue and acute viral thyroiditis.  CXR at the time of this visit was negative for pneumonia, CBC, CMP and TSH were all within normal limits.  PMH significant for previous COVID-19 infection and HLD.  Patient is accompanied by her daughter this afternoon.  Past Medical History:  Diagnosis Date   COVID-19 09/28/2021   Elevated cholesterol     There are no problems to display for this patient.   Past Surgical History:  Procedure Laterality Date   LAPAROSCOPIC TUBAL LIGATION     TONSILLECTOMY      OB History   No obstetric history on file.      Home Medications    Prior to Admission medications   Medication Sig Start Date End Date Taking? Authorizing Provider  levothyroxine (SYNTHROID) 75 MCG tablet Take 75 mcg by mouth daily. 07/19/21   [provider]    Family History Family History  Problem Relation Age of Onset   Hypertension Mother    Dementia Mother    Aneurysm Mother    Hernia Mother    Breast cancer Neg Hx     Social History Social History   Tobacco Use   Smoking status: Former   Smokeless tobacco: Never  Building services engineer Use: Never used  Substance Use Topics   Alcohol use: No     Allergies   Patient has no allergy information on record.   Review of Systems Review of Systems  Constitutional:  Positive for fatigue.  All other systems reviewed and are negative.    Physical Exam Triage Vital Signs ED Triage Vitals  Enc Vitals Group     BP 10/29/21 1520 (!) 162/84     Pulse Rate  10/29/21 1520 74     Resp 10/29/21 1520 16     Temp 10/29/21 1520 98.4 F (36.9 C)     Temp Source 10/29/21 1520 Oral     SpO2 10/29/21 1520 98 %     Weight 10/29/21 1521 123 lb 7.3 oz (56 kg)     Height 10/29/21 1521 4\' 11"  (1.499 m)     Head Circumference --      Peak Flow --      Pain Score 10/29/21 1520 4     Pain Loc --      Pain Edu? --      Excl. in GC? --    No data found.  Updated Vital Signs BP (!) 162/84 (BP Location: Left Arm)   Pulse 74   Temp 98.4 F (36.9 C) (Oral)   Resp 16   Ht 4\' 11"  (1.499 m)   Wt 123 lb 7.3 oz (56 kg)   LMP 12/16/2007 (Approximate)   SpO2 98%   BMI 24.94 kg/m      Physical Exam Vitals and nursing note reviewed.  Constitutional:      General: She is not in acute distress.    Appearance: Normal appearance. She is normal weight. She is not ill-appearing, toxic-appearing or diaphoretic.  HENT:  Head: Normocephalic and atraumatic.     Right Ear: Tympanic membrane, ear canal and external ear normal.     Left Ear: Tympanic membrane, ear canal and external ear normal.     Mouth/Throat:     Mouth: Mucous membranes are moist.     Pharynx: Oropharynx is clear.  Eyes:     Extraocular Movements: Extraocular movements intact.     Conjunctiva/sclera: Conjunctivae normal.     Pupils: Pupils are equal, round, and reactive to light.  Cardiovascular:     Rate and Rhythm: Normal rate and regular rhythm.     Pulses: Normal pulses.     Heart sounds: Normal heart sounds. No murmur heard. Pulmonary:     Effort: Pulmonary effort is normal.     Breath sounds: Normal breath sounds.  Musculoskeletal:     Cervical back: Normal range of motion and neck supple.  Skin:    General: Skin is warm and dry.  Neurological:     General: No focal deficit present.     Mental Status: She is alert and oriented to person, place, and time.      UC Treatments / Results  Labs (all labs ordered are listed, but only abnormal results are displayed) Labs  Reviewed - No data to display  EKG   Radiology No results found.  Procedures Procedures (including critical care time)  Medications Ordered in UC Medications - No data to display  Initial Impression / Assessment and Plan / UC Course  I have reviewed the triage vital signs and the nursing notes.  Pertinent labs & imaging results that were available during my care of the patient were reviewed by me and considered in my medical decision making (see chart for details).     MDM: Fatigue-lengthy (35 minutes total) discussion with patient/Mother on need for head to toe physical exam preceded by fasting baseline labs, and follow-up medical history.  Patient/daughter explained dissatisfaction with previous PCP in [place], [place] and was very difficult to get appointments with him.  Daughter reports that her Mother has been living in [place], [place] and is now moving to [place], [place] to live with her.  Daughter reports has been looking for PCPs in this area for the past several weeks; however, has not established with PCP as of yet. Advised patient/daughter to establish care with [facility] primary care provider today for further evaluation of current symptoms.  Patient/daughter agreed and verbalized understanding of this plan of care today. Final Clinical Impressions(s) / UC Diagnoses   Final diagnoses:  Fatigue, unspecified type     Discharge Instructions      Advised patient/daughter to establish care with [facility] primary care provider today for further evaluation of current symptoms.  Patient/daughter agreed and verbalized understanding of this plan of care today.     ED Prescriptions   None    PDMP not reviewed this encounter.   Trevor Iha, FNP 10/29/21 [phone]

## 2021-10-29 NOTE — Discharge Instructions (Addendum)
Advised patient/daughter to establish care with [facility] primary care provider today for further evaluation of current symptoms.  Patient/daughter agreed and verbalized understanding of this plan of care today.

## 2022-09-14 ENCOUNTER — Other Ambulatory Visit: Payer: Self-pay

## 2022-09-14 DIAGNOSIS — Z1231 Encounter for screening mammogram for malignant neoplasm of breast: Secondary | ICD-10-CM

## 2022-10-17 ENCOUNTER — Ambulatory Visit: Payer: Self-pay | Attending: Hematology and Oncology | Admitting: Hematology and Oncology

## 2022-10-17 ENCOUNTER — Ambulatory Visit
Admission: RE | Admit: 2022-10-17 | Discharge: 2022-10-17 | Disposition: A | Discharge status: Home / Self Care | Payer: Self-pay | Source: Ambulatory Visit | Attending: Obstetrics and Gynecology | Admitting: Obstetrics and Gynecology

## 2022-10-17 VITALS — BP 137/64 | Wt 124.8 lb

## 2022-10-17 DIAGNOSIS — Z1231 Encounter for screening mammogram for malignant neoplasm of breast: Secondary | ICD-10-CM

## 2022-10-17 NOTE — Progress Notes (Signed)
[name] is a 67 y.o. female who presents to [facility] clinic today with no complaints.    Pap Smear: Pap not smear completed today. Last Pap smear was 12/26/2018 at [facility] and was normal. Per patient has no history of an abnormal Pap smear. Last Pap smear result is available in Epic. As she is 67 yo, she will no longer need Pap smears.    Physical exam: Breasts Breasts symmetrical. No skin abnormalities bilateral breasts. No nipple retraction bilateral breasts. No nipple discharge bilateral breasts. No lymphadenopathy. No lumps palpated bilateral breasts. MS DIGITAL SCREENING TOMO BILATERAL  Result Date: 08/05/2020 CLINICAL DATA:  Screening. EXAM: DIGITAL SCREENING BILATERAL MAMMOGRAM WITH TOMOSYNTHESIS AND CAD TECHNIQUE: Bilateral screening digital craniocaudal and mediolateral oblique mammograms were obtained. Bilateral screening digital breast tomosynthesis was performed. The images were evaluated with computer-aided detection. COMPARISON:  Previous exam(s). ACR Breast Density Category b: There are scattered areas of fibroglandular density. FINDINGS: There are no findings suspicious for malignancy. The images were evaluated with computer-aided detection. IMPRESSION: No mammographic evidence of malignancy. A result letter of this screening mammogram will be mailed directly to the patient. RECOMMENDATION: Screening mammogram in one year. (Code:[id number]-01Y) BI-RADS CATEGORY  1: Negative. Electronically Signed   By: Beckie Salts M.D.   On: 08/05/2020 15:22   MS DIGITAL DIAG TOMO UNI RIGHT  Result Date: 07/23/2019 CLINICAL DATA:  Short-term follow-up for a probably benign mass in the right breast, initially assessed on 01/04/2019. EXAM: DIGITAL DIAGNOSTIC RIGHT MAMMOGRAM WITH CAD AND TOMO ULTRASOUND RIGHT BREAST COMPARISON:  Previous exam(s). ACR Breast Density Category b: There are scattered areas of fibroglandular density. FINDINGS: The mammographic mass noted on the prior exam is  no longer visualized. There are no new or suspicious masses, no areas of architectural distortion, no significant areas of asymmetry and no suspicious calcifications. Mammographic images were processed with CAD. Targeted ultrasound is performed, showing normal tissue in the right breast at 6 o'clock, 2 cm from the nipple. The probably benign mass seen on the prior exam is no longer visualized. IMPRESSION: 1. No evidence of breast malignancy. The probably benign mass noted in the right breast on the prior study has resolved consistent with a resolved cyst. RECOMMENDATION: 1. Screening mammogram in August 2021, last screening study dated 12/26/2018.(Code:[id number]) I have discussed the findings and recommendations with the patient. If applicable, a reminder letter will be sent to the patient regarding the next appointment. BI-RADS CATEGORY  1: Negative. Electronically Signed   By: Amie Portland M.D.   On: 07/23/2019 11:10   MS DIGITAL DIAG TOMO UNI RIGHT  Result Date: 01/04/2019 CLINICAL DATA:  Recall from screening mammography with tomosynthesis, possible mass involving the retroareolar RIGHT breast at MIDDLE depth. EXAM: DIGITAL DIAGNOSTIC RIGHT MAMMOGRAM WITH TOMO ULTRASOUND RIGHT BREAST COMPARISON:  Previous exam(s). ACR Breast Density Category b: There are scattered areas of fibroglandular density. FINDINGS: Tomosynthesis and synthesized spot-compression CC and MLO views of the area of concern in the RIGHT breast were obtained. The mass questioned on screening mammography persists on the spot compression views with mildly vague margins, measuring approximately 6 mm. There is no associated architectural distortion or suspicious calcifications. Targeted RIGHT breast ultrasound is performed, showing a nearly anechoic mass with thin internal septations versus clustered cysts at the 6 o'clock position approximately 2 cm from the nipple at MIDDLE depth measuring approximately 5 x 5 x 6 mm, demonstrating posterior  acoustic enhancement and no internal color Doppler flow, corresponding to the screening mammographic finding.  IMPRESSION: Likely benign 6 mm complex cyst or clustered cysts in the LOWER RIGHT breast at the 6 o'clock position approximately 2 cm from the nipple which accounts for the screening mammographic finding. RECOMMENDATION: Diagnostic RIGHT mammogram and RIGHT breast ultrasound in 6 months. I have discussed the findings and recommendations with the patient. Communication with the patient was achieved with the assistance of a certified interpreter. If applicable, a reminder letter will be sent to the patient regarding the next appointment. BI-RADS CATEGORY  3: Probably benign. Electronically Signed   By: Hulan Saas M.D.   On: 01/04/2019 15:31   MS DIGITAL SCREENING TOMO BILATERAL  Result Date: 12/26/2018 CLINICAL DATA:  Screening. EXAM: DIGITAL SCREENING BILATERAL MAMMOGRAM WITH TOMO AND CAD COMPARISON:  Previous exam(s). ACR Breast Density Category b: There are scattered areas of fibroglandular density. FINDINGS: In the right breast, a possible mass warrants further evaluation. In the left breast, no findings suspicious for malignancy. Images were processed with CAD. IMPRESSION: Further evaluation is suggested for possible mass in the right breast. RECOMMENDATION: Diagnostic mammogram and possibly ultrasound of the right breast. (Code:[id number]) The patient will be contacted regarding the findings, and additional imaging will be scheduled. BI-RADS CATEGORY  0: Incomplete. Need additional imaging evaluation and/or prior mammograms for comparison. Electronically Signed   By: Baird Lyons M.D.   On: 12/26/2018 16:23          Pelvic/Bimanual Pap is not indicated today    Smoking History: Patient has never smoked and was not referred to quit line.    Patient Navigation: Patient education provided. Access to services provided for patient through [facility] program. Delos Haring interpreter provided. No  transportation provided   Colorectal Cancer Screening: Per patient had colonoscopy on 09/29/2016 with diverticulosis in the sigmoid colon, benign. Recommend repeat in 10 year.  No complaints today. Will continue to follow with [facility]   Breast and Cervical Cancer Risk Assessment: Patient does not have family history of breast cancer, known genetic mutations, or radiation treatment to the chest before age 3. Patient does not have history of cervical dysplasia, immunocompromised, or DES exposure in-utero.  Risk Assessment   No risk assessment data for the current encounter  Risk Scores       08/04/2020   Last edited by: Jim Like, RN   5-year risk: 0.8 %   Lifetime risk: 3.9 %            A: [facility] exam without pap smear No complaints with benign exam.   P: Referred patient to the [facility] for a screening mammogram. Appointment scheduled 10/17/22.  Ilda Basset A, NP 10/17/2022 10:26 AM

## 2022-10-17 NOTE — Patient Instructions (Signed)
Taught Jordan Watts how to perform BSE and gave educational materials to take home. Patient did not need a Pap smear today due to last Pap smear was in 12/26/2018 per patient.  Let her know [facility] will cover Pap smears every 5 years unless has a history of abnormal Pap smears. Referred patient to the [facility] for screening mammogram. Appointment scheduled for 10/17/22. Patient aware of appointment and will be there. Let patient know will follow up with her within the next couple weeks with results. Jordan Watts verbalized understanding.  [name], NP 10:32 AM

## 2024-05-30 ENCOUNTER — Other Ambulatory Visit: Payer: Self-pay

## 2024-05-30 ENCOUNTER — Ambulatory Visit: Admission: EM | Admit: 2024-05-30 | Discharge: 2024-05-30 | Disposition: A | Discharge status: Home / Self Care

## 2024-05-30 DIAGNOSIS — B370 Candidal stomatitis: Secondary | ICD-10-CM

## 2024-05-30 DIAGNOSIS — R5383 Other fatigue: Secondary | ICD-10-CM | POA: Diagnosis not present

## 2024-05-30 DIAGNOSIS — K146 Glossodynia: Secondary | ICD-10-CM | POA: Diagnosis not present

## 2024-05-30 MED ORDER — NYSTATIN 100000 UNIT/ML MT SUSP
5.0000 mL | Freq: Four times a day (QID) | OROMUCOSAL | 0 refills | Status: AC
  Filled 2024-05-30: qty 200, 10d supply, fill #0

## 2024-05-30 NOTE — ED Provider Notes (Signed)
 " [facility]    CSN: [id number] Arrival date & time: 05/30/24  1720      History   Chief Complaint Chief Complaint  Patient presents with   Fatigue    HPI Jordan Watts is a 69 y.o. female.  Patient presents with 2 to 3-week history of mouth pain, especially to her tongue.  She also reports bitter taste in her mouth, fatigue, abdominal bloating, muscle aches, robot feeling x 2 to 3 weeks.  She attributed her symptoms to Zetia so she stopped taking this medication 2 weeks ago.  Her symptoms improved but her fatigue and mouth pain continues.  She denies fever, chills, chest pain, shortness of breath, cough, abdominal pain, nausea, vomiting, diarrhea, constipation, dysuria, hematuria.  Last bowel movement this morning.  Patient states she has an appointment scheduled with her PCP on 06/14/2024.  The history is provided by the patient and medical records. A language interpreter was used.    Past Medical History:  Diagnosis Date   COVID-19 09/28/2021   Elevated cholesterol    Thyroid disease     There are no active problems to display for this patient.   Past Surgical History:  Procedure Laterality Date   LAPAROSCOPIC TUBAL LIGATION     TONSILLECTOMY      OB History   No obstetric history on file.      Home Medications    Prior to Admission medications  Medication Sig Start Date End Date Taking? Authorizing Provider  ezetimibe (ZETIA) 10 MG tablet Take 10 mg by mouth daily. 03/11/24  Yes [provider]  fluticasone (FLONASE) 50 MCG/ACT nasal spray Place 1 spray into both nostrils daily. 01/25/24  Yes [provider]  magic mouthwash (nystatin , diphenhydrAMINE, alum & mag hydroxide) suspension mixture Swish and swallow 5 mLs 4 (four) times daily for 10 days. 05/30/24 06/09/24 Yes Corlis Burnard DEL, NP  cetirizine (ZYRTEC) 10 MG tablet Take 10 mg by mouth daily.    [provider]  levothyroxine (SYNTHROID) 75 MCG tablet Take 75 mcg  by mouth daily. 07/19/21   [provider]    Family History Family History  Problem Relation Age of Onset   Hypertension Mother    Dementia Mother    Aneurysm Mother    Hernia Mother    Breast cancer Neg Hx     Social History Social History[1]   Allergies   Bee venom   Review of Systems Review of Systems  Constitutional:  Positive for fatigue. Negative for chills and fever.  HENT:  Negative for ear pain, mouth sores, sore throat, trouble swallowing and voice change.        Soreness of mouth and tongue  Respiratory:  Negative for cough and shortness of breath.   Cardiovascular:  Negative for chest pain and palpitations.  Gastrointestinal:  Negative for abdominal pain, blood in stool, constipation, diarrhea, nausea and vomiting.  Genitourinary:  Negative for dysuria and hematuria.     Physical Exam Triage Vital Signs ED Triage Vitals  Encounter Vitals Group     BP 05/30/24 1741 (!) 143/84     Girls Systolic BP Percentile --      Girls Diastolic BP Percentile --      Boys Systolic BP Percentile --      Boys Diastolic BP Percentile --      Pulse Rate 05/30/24 1741 89     Resp 05/30/24 1741 18     Temp 05/30/24 1741 98.8 F (37.1 C)  Temp src --      SpO2 05/30/24 1741 97 %     Weight --      Height --      Head Circumference --      Peak Flow --      Pain Score 05/30/24 1752 0     Pain Loc --      Pain Education --      Exclude from Growth Chart --    No data found.  Updated Vital Signs BP (!) 143/84   Pulse 89   Temp 98.8 F (37.1 C)   Resp 18   LMP 12/16/2007   SpO2 97%   Visual Acuity Right Eye Distance:   Left Eye Distance:   Bilateral Distance:    Right Eye Near:   Left Eye Near:    Bilateral Near:     Physical Exam Constitutional:      General: She is not in acute distress. HENT:     Right Ear: Tympanic membrane normal.     Left Ear: Tympanic membrane normal.     Nose: Nose normal.     Mouth/Throat:     Mouth: Mucous  membranes are moist.     Pharynx: Oropharynx is clear.     Comments: Patient has 2 superficial fissures in her tongue which she reports are new over the last 2 to 3 weeks.  No sores or bleeding. Cardiovascular:     Rate and Rhythm: Normal rate and regular rhythm.     Heart sounds: Normal heart sounds.  Pulmonary:     Effort: Pulmonary effort is normal. No respiratory distress.     Breath sounds: Normal breath sounds.  Abdominal:     General: Bowel sounds are normal.     Palpations: Abdomen is soft.     Tenderness: There is no abdominal tenderness. There is no guarding or rebound.  Neurological:     Mental Status: She is alert.      UC Treatments / Results  Labs (all labs ordered are listed, but only abnormal results are displayed) Labs Reviewed - No data to display  EKG   Radiology No results found.  Procedures Procedures (including critical care time)  Medications Ordered in UC Medications - No data to display  Initial Impression / Assessment and Plan / UC Course  I have reviewed the triage vital signs and the nursing notes.  Pertinent labs & imaging results that were available during my care of the patient were reviewed by me and considered in my medical decision making (see chart for details).    Soreness of tongue, oral thrush, fatigue.  Afebrile and vital signs are stable.  Treating patient's mouth symptoms with Magic mouthwash (nystatin , diphenhydramine, aluminum and magnesium hydroxide).  Instructed patient to follow-up with her PCP as soon as possible for her mouth symptoms as well as her fatigue and other concerns..  Education provided on oral thrush and fatigue.  ED precautions given.  She agrees to plan of care.  Final Clinical Impressions(s) / UC Diagnoses   Final diagnoses:  Soreness of tongue  Oral thrush  Fatigue, unspecified type     Discharge Instructions      Use the Magic mouthwash as directed for your mouth pain.    Follow-up with your  primary care provider soon as possible to discuss your fatigue and other concerns.  Go to the emergency department if you have worsening symptoms.     ED Prescriptions     Medication Sig  Dispense Auth. Provider   magic mouthwash (nystatin , diphenhydrAMINE, alum & mag hydroxide) suspension mixture Swish and swallow 5 mLs 4 (four) times daily for 10 days. 200 mL Corlis Burnard DEL, NP      PDMP not reviewed this encounter.     [1]  Social History Tobacco Use   Smoking status: Former   Smokeless tobacco: Never  Vaping Use   Vaping status: Never Used  Substance Use Topics   Alcohol use: No   Drug use: Never     Corlis Burnard DEL, NP 05/30/24 1840  "

## 2024-05-30 NOTE — Discharge Instructions (Addendum)
 Use the Magic mouthwash as directed for your mouth pain.    Follow-up with your primary care provider soon as possible to discuss your fatigue and other concerns.  Go to the emergency department if you have worsening symptoms.

## 2024-05-30 NOTE — ED Triage Notes (Signed)
 Patient presents to UC for constant fatigue, bitter taste and burning sensation to tongue x 2 weeks. She states her PCP started her on Zetia and she noticed she developed neuropathy sensation to her body, robot feeling  and weird taste. This sensation has now moved to her throat. She stopped taking the med and has noticed some improvement. She tried an OTC oral relief solution with no relief. She has an appt with her pcp in March.

## 2024-05-31 IMAGING — DX DG CHEST 2V
2 series · 2 of 2 positions shown · non-contrast
Comparison: 10/08/2015.

CLINICAL DATA: Cough, fatigue and chest discomfort for 2-3 weeks.

EXAM:
CHEST - 2 VIEW

[chest pa]
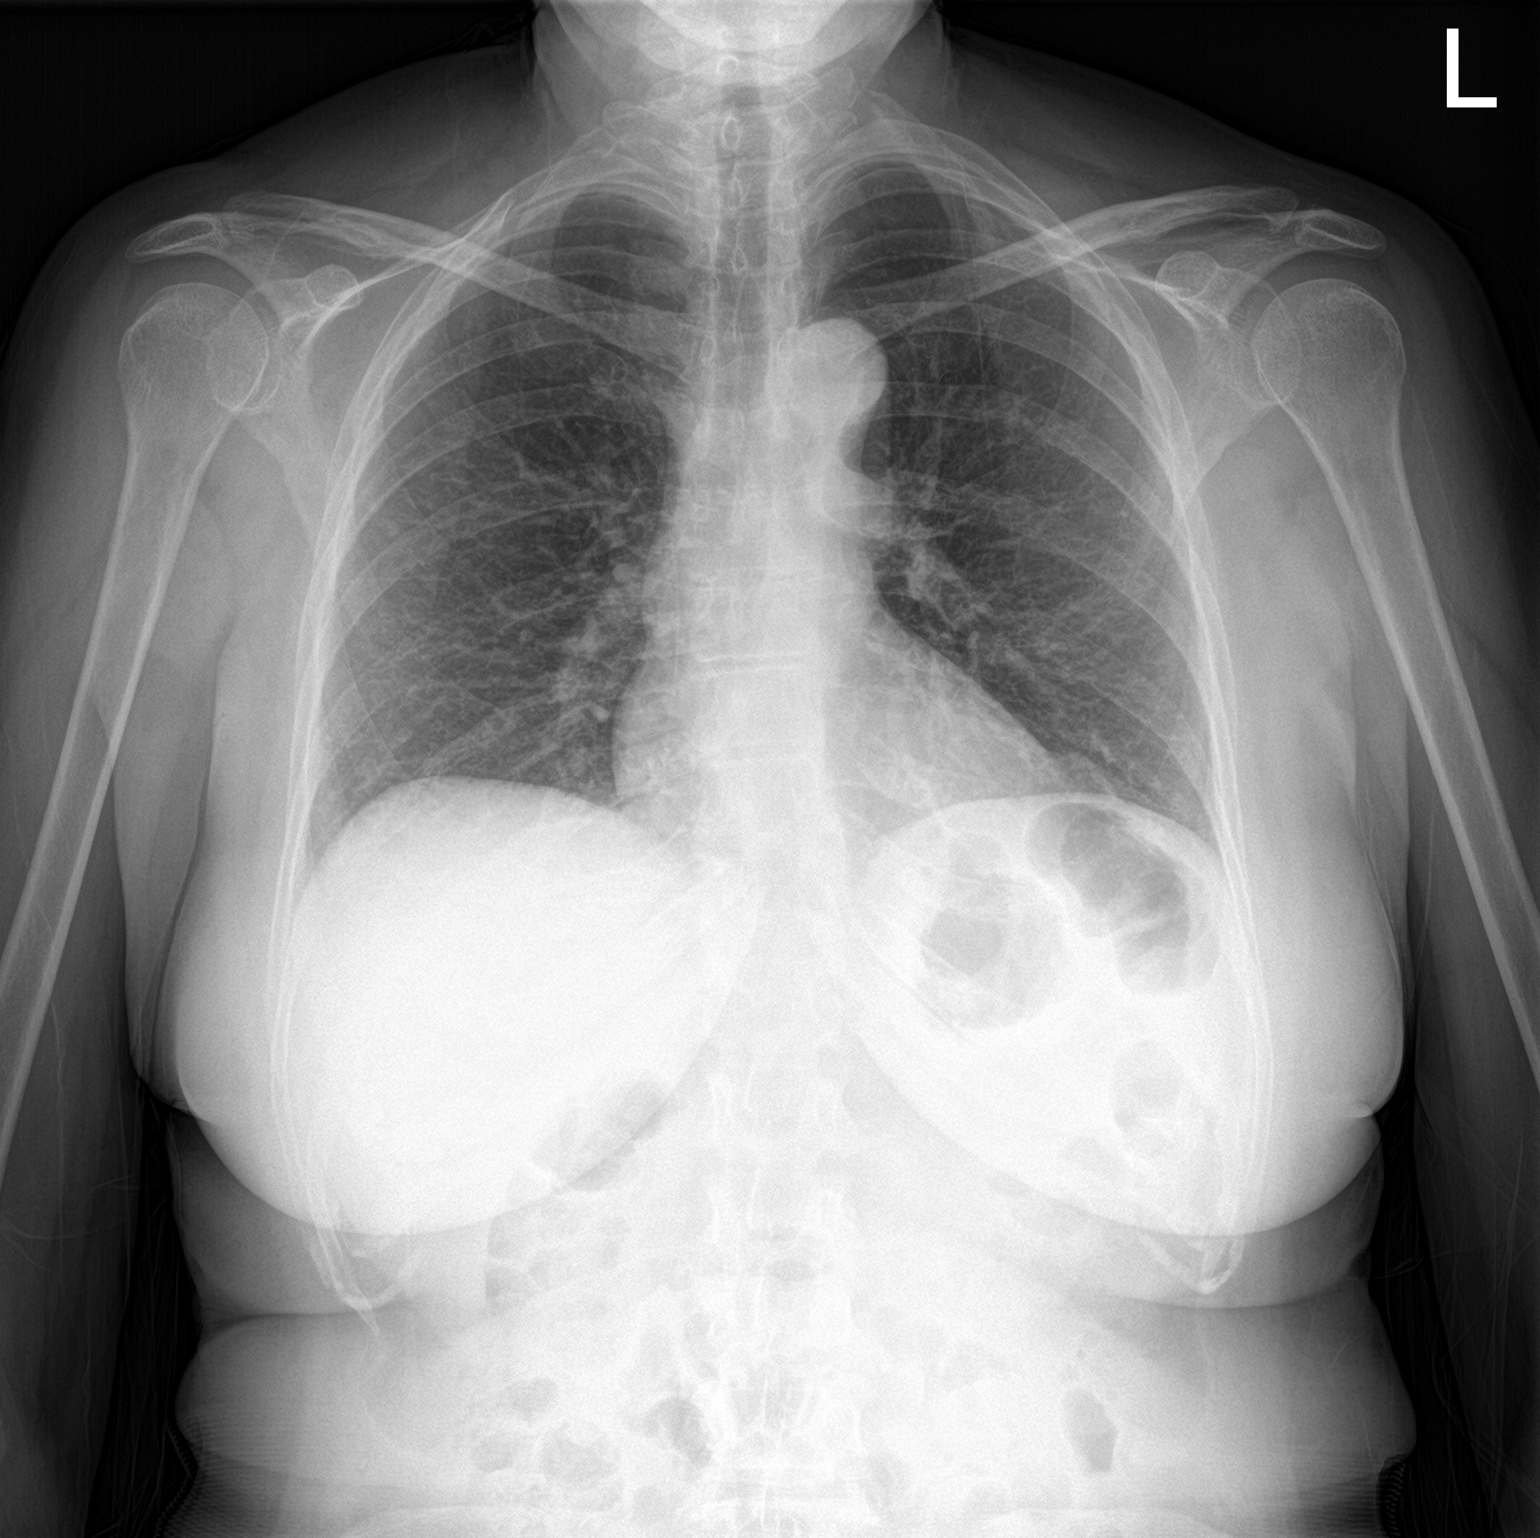

[chest lat]
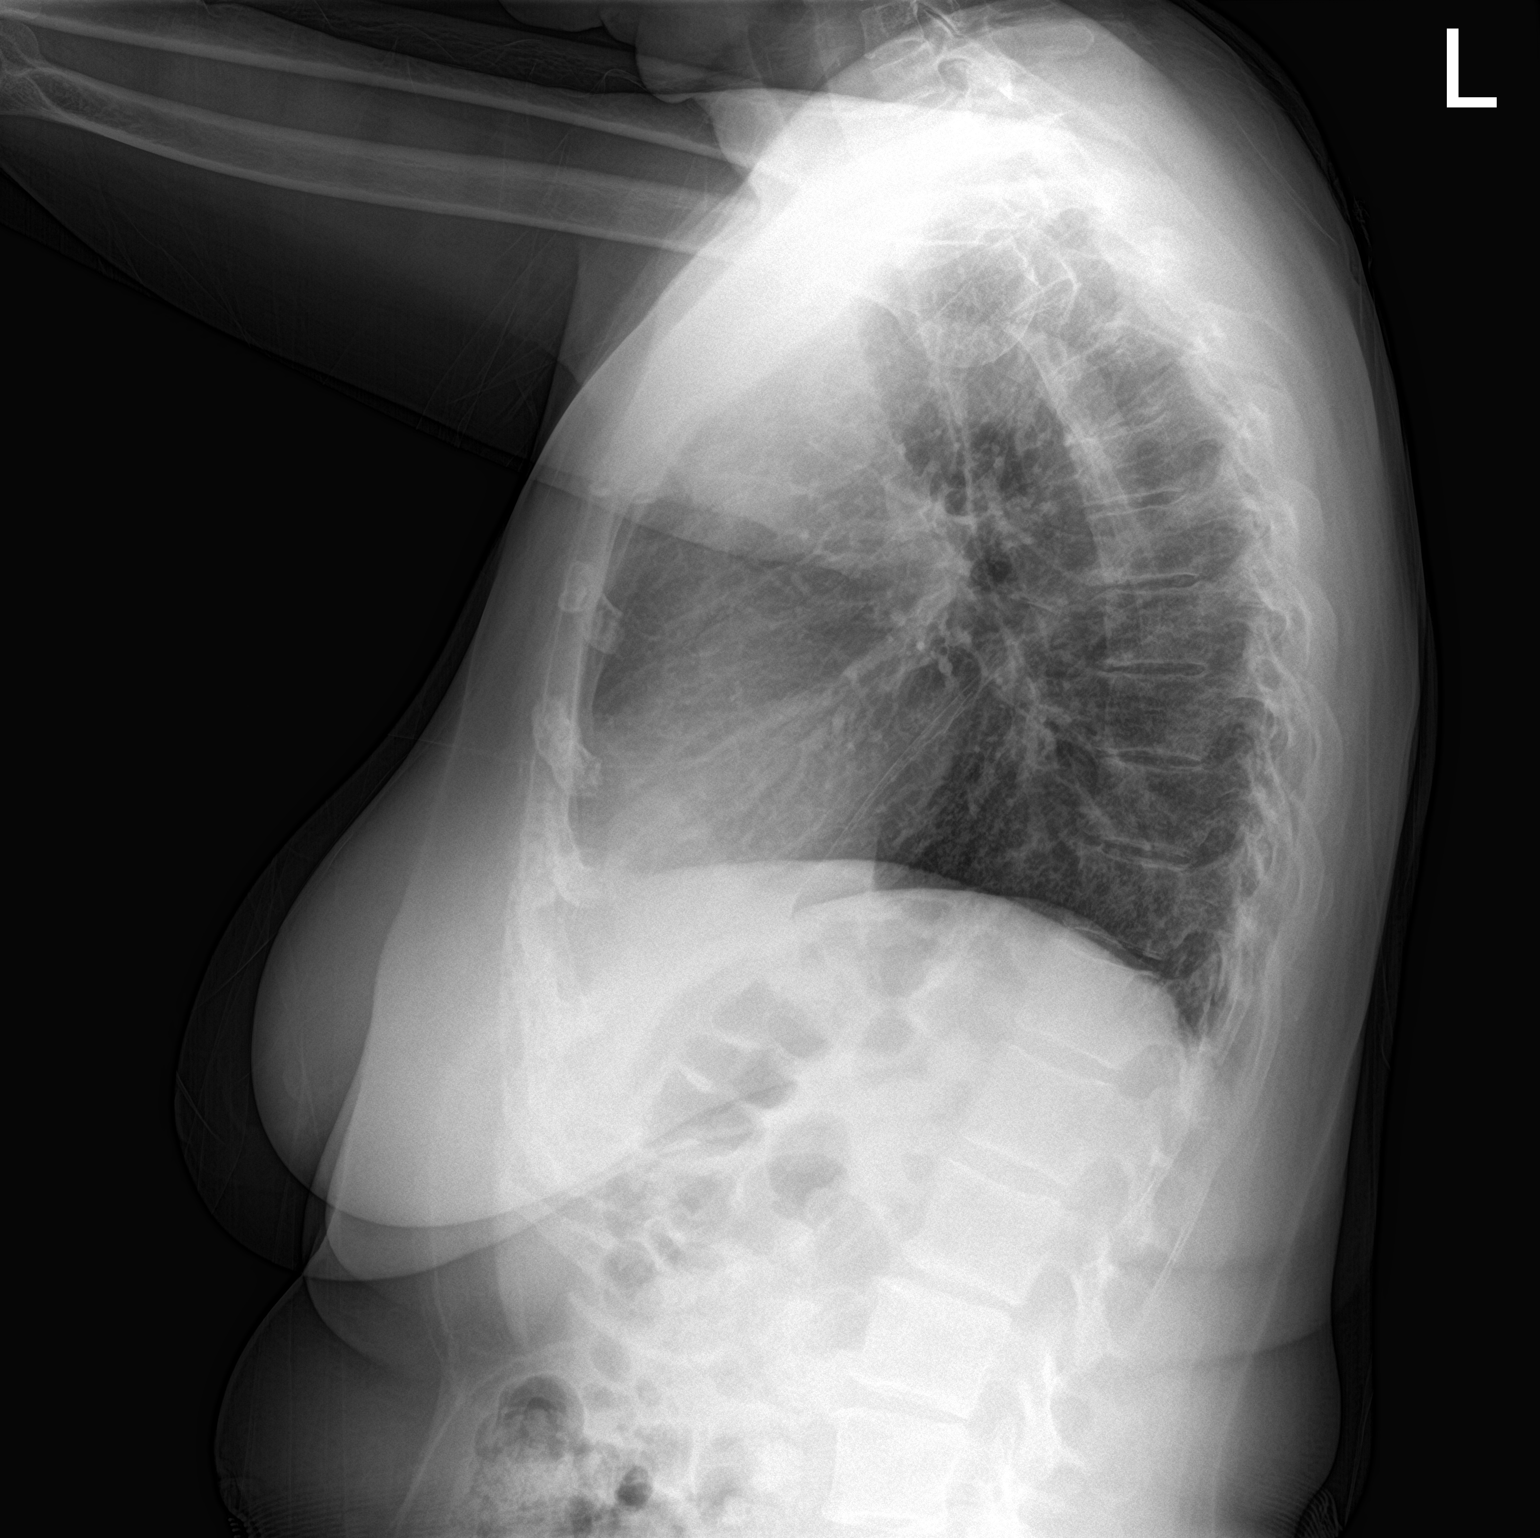

[2 of 2 positions shown; findings below may reference images not displayed]

FINDINGS: Normal heart, mediastinum and hila.

Clear lungs.  No pleural effusion or pneumothorax.

Skeletal structures are intact.
IMPRESSION: No active cardiopulmonary disease.

## 2024-08-16 ENCOUNTER — Ambulatory Visit: Admitting: Nurse Practitioner
